# Patient Record
Sex: Female | Born: 1995 | Race: Black or African American | Hispanic: No | Marital: Single | Smoking: Never smoker
Health system: Southern US, Community
[De-identification: ages and names within clinical notes are randomized; demographics above are authoritative.]

## PROBLEM LIST (undated history)

## (undated) DIAGNOSIS — J45909 Unspecified asthma, uncomplicated: Secondary | ICD-10-CM

## (undated) DIAGNOSIS — N390 Urinary tract infection, site not specified: Secondary | ICD-10-CM

## (undated) DIAGNOSIS — B9689 Other specified bacterial agents as the cause of diseases classified elsewhere: Secondary | ICD-10-CM

## (undated) DIAGNOSIS — N760 Acute vaginitis: Secondary | ICD-10-CM

## (undated) HISTORY — PX: NO PAST SURGERIES: SHX2092

---

## 2014-06-21 ENCOUNTER — Ambulatory Visit
Admission: EM | Admit: 2014-06-21 | Discharge: 2014-06-21 | Disposition: A | Discharge status: Home / Self Care | Payer: Medicaid Other | Attending: Family Medicine | Admitting: Family Medicine

## 2014-06-21 DIAGNOSIS — Z79899 Other long term (current) drug therapy: Secondary | ICD-10-CM | POA: Insufficient documentation

## 2014-06-21 DIAGNOSIS — N390 Urinary tract infection, site not specified: Secondary | ICD-10-CM | POA: Insufficient documentation

## 2014-06-21 DIAGNOSIS — N760 Acute vaginitis: Secondary | ICD-10-CM | POA: Diagnosis not present

## 2014-06-21 DIAGNOSIS — N3090 Cystitis, unspecified without hematuria: Secondary | ICD-10-CM | POA: Diagnosis present

## 2014-06-21 DIAGNOSIS — B9689 Other specified bacterial agents as the cause of diseases classified elsewhere: Secondary | ICD-10-CM | POA: Insufficient documentation

## 2014-06-21 DIAGNOSIS — A499 Bacterial infection, unspecified: Secondary | ICD-10-CM

## 2014-06-21 HISTORY — DX: Urinary tract infection, site not specified: N39.0

## 2014-06-21 HISTORY — DX: Other specified bacterial agents as the cause of diseases classified elsewhere: B96.89

## 2014-06-21 HISTORY — DX: Unspecified asthma, uncomplicated: J45.909

## 2014-06-21 HISTORY — DX: Acute vaginitis: N76.0

## 2014-06-21 LAB — URINALYSIS COMPLETE WITH MICROSCOPIC (ARMC ONLY)
Glucose, UA: NEGATIVE mg/dL
Ketones, ur: NEGATIVE mg/dL
NITRITE: NEGATIVE
Protein, ur: 100 mg/dL — AB
Specific Gravity, Urine: 1.03 (ref 1.005–1.030)
pH: 6 (ref 5.0–8.0)

## 2014-06-21 LAB — WET PREP, GENITAL
Clue Cells Wet Prep HPF POC: NONE SEEN
TRICH WET PREP: NONE SEEN
Yeast Wet Prep HPF POC: NONE SEEN

## 2014-06-21 LAB — CHLAMYDIA/NGC RT PCR (ARMC ONLY)
Chlamydia Tr: NOT DETECTED
N gonorrhoeae: NOT DETECTED

## 2014-06-21 MED ORDER — CIPROFLOXACIN HCL 500 MG PO TABS: 500.0000 mg | ORAL_TABLET | Freq: Two times a day (BID) | ORAL | Status: DC

## 2014-06-21 MED ORDER — FLUCONAZOLE 150 MG PO TABS: 150.0000 mg | ORAL_TABLET | Freq: Once | ORAL | Status: DC

## 2014-06-21 MED ORDER — METRONIDAZOLE 500 MG PO TABS: 500.0000 mg | ORAL_TABLET | Freq: Two times a day (BID) | ORAL | Status: DC

## 2014-06-21 NOTE — ED Provider Notes (Addendum)
CSN: [id number]     Arrival date & time 06/21/14  1409 History   First MD Initiated Contact with Patient 06/21/14 1451     Chief Complaint  Patient presents with  . Cystitis   (Consider location/radiation/quality/duration/timing/severity/associated sxs/prior Treatment) Patient is a 19 y.o. female presenting with vaginal discharge and frequency. The history is provided by the patient. No language interpreter was used.  Vaginal Discharge Quality:  Thick and white Severity:  Moderate Onset quality:  Gradual Duration:  3 days Progression:  Worsening Chronicity:  Recurrent Relieved by:  Nothing Worsened by:  Nothing tried Ineffective treatments:  None tried Associated symptoms: abdominal pain, dysuria and urinary frequency   Associated symptoms: no dyspareunia   Risk factors: no new sexual partner and no STI exposure   Urinary Frequency This is a new problem. The problem occurs constantly. The problem has been gradually worsening. Associated symptoms include abdominal pain. Pertinent negatives include no chest pain and no shortness of breath. Nothing aggravates the symptoms. Nothing relieves the symptoms. She has tried nothing for the symptoms.    Past Medical History  Diagnosis Date  . Asthma   . UTI (lower urinary tract infection)   . BV (bacterial vaginosis)    History reviewed. No pertinent past surgical history. History reviewed. No pertinent family history. History  Substance Use Topics  . Smoking status: Never Smoker   . Smokeless tobacco: Not on file  . Alcohol Use: No   OB History    No data available     Review of Systems  Respiratory: Negative for shortness of breath.   Cardiovascular: Negative for chest pain.  Gastrointestinal: Positive for abdominal pain.  Genitourinary: Positive for dysuria, frequency and vaginal discharge. Negative for dyspareunia.    Allergies  Review of patient's allergies indicates no known allergies.  Home Medications   Prior to  Admission medications   Medication Sig Start Date End Date Taking? Authorizing Provider  albuterol (PROVENTIL HFA;VENTOLIN HFA) 108 (90 BASE) MCG/ACT inhaler Inhale 2 puffs into the lungs every 6 (six) hours as needed for wheezing or shortness of breath.    Historical Provider, MD  ciprofloxacin (CIPRO) 500 MG tablet Take 1 tablet (500 mg total) by mouth 2 (two) times daily. 06/21/14   Hassan Rowan, MD  fluconazole (DIFLUCAN) 150 MG tablet Take 1 tablet (150 mg total) by mouth once. 06/21/14   Hassan Rowan, MD  metroNIDAZOLE (FLAGYL) 500 MG tablet Take 1 tablet (500 mg total) by mouth 2 (two) times daily. 06/21/14   Hassan Rowan, MD   BP 121/73 mmHg  Pulse 104  Temp(Src) 98.2 F (36.8 C) (Tympanic)  Resp 16  Ht  (1.499 m)  Wt 140 lb (63.504 kg)  BMI 28.26 kg/m2  SpO2 100%  LMP 05/28/2014 (Approximate) Physical Exam  Constitutional: She is oriented to person, place, and time. She appears well-developed and well-nourished.  HENT:  Head: Normocephalic and atraumatic.  Abdominal: Soft. Bowel sounds are normal. She exhibits no distension. There is no tenderness. There is no rebound. Hernia confirmed negative in the right inguinal area and confirmed negative in the left inguinal area.  Genitourinary: Rectum normal. There is no rash or tenderness on the right labia. There is no rash or tenderness on the left labia. Uterus is not deviated, not enlarged and not fixed. Cervix exhibits discharge. Cervix exhibits no motion tenderness and no friability. Right adnexum displays no mass, no tenderness and no fullness. Left adnexum displays no mass and no tenderness. No erythema, tenderness or  bleeding in the vagina. No foreign body around the vagina. Vaginal discharge found.  Musculoskeletal: Normal range of motion.  Neurological: She is oriented to person, place, and time.  Skin: Skin is warm.  Psychiatric: Her behavior is normal.    ED Course  Procedures (including critical care time) Labs  Review  Results for orders placed or performed during the hospital encounter of 06/21/14  Urine culture  Result Value Ref Range   Specimen Description URINE, CLEAN CATCH    Special Requests Normal    Culture >=100,000 COLONIES/mL ESCHERICHIA COLI    Report Status 06/23/2014 FINAL    Organism ID, Bacteria ESCHERICHIA COLI       Susceptibility   Escherichia coli - MIC*    AMPICILLIN 4 SENSITIVE Sensitive     CEFTAZIDIME <=1 SENSITIVE Sensitive     CEFAZOLIN <=4 SENSITIVE Sensitive     CEFTRIAXONE <=1 SENSITIVE Sensitive     CIPROFLOXACIN <=0.25 SENSITIVE Sensitive     GENTAMICIN <=1 SENSITIVE Sensitive     IMIPENEM <=0.25 SENSITIVE Sensitive     TRIMETH/SULFA <=20 SENSITIVE Sensitive     CEFOXITIN <=4 SENSITIVE Sensitive     * >=100,000 COLONIES/mL ESCHERICHIA COLI  Chlamydia/NGC rt PCR  Result Value Ref Range   Specimen source GC/Chlam RANDOM URINE    Chlamydia Tr NOT DETECTED    N gonorrhoeae NOT DETECTED   Wet prep, genital  Result Value Ref Range   Yeast Wet Prep HPF POC NONE SEEN NONE SEEN   Trich, Wet Prep NONE SEEN NONE SEEN   Clue Cells Wet Prep HPF POC NONE SEEN NONE SEEN   WBC, Wet Prep HPF POC FEW (A) NONE SEEN  Urinalysis complete, with microscopic  Result Value Ref Range   Color, Urine YELLOW YELLOW   APPearance CLOUDY (A) CLEAR   Glucose, UA NEGATIVE NEGATIVE mg/dL   Bilirubin Urine 1+ (A) NEGATIVE   Ketones, ur NEGATIVE NEGATIVE mg/dL   Specific Gravity, Urine 1.030 1.005 - 1.030   Hgb urine dipstick 2+ (A) NEGATIVE   pH 6.0 5.0 - 8.0   Protein, ur 100 (A) NEGATIVE mg/dL   Nitrite NEGATIVE NEGATIVE   Leukocytes, UA 2+ (A) NEGATIVE   RBC / HPF 0-5 <3 RBC/hpf   WBC, UA TOO NUMEROUS TO COUNT <3 WBC/hpf   Bacteria, UA FEW (A) RARE   Squamous Epithelial / LPF 0-5 (A) RARE      Imaging Review No results found.   MDM   1. UTI (urinary tract infection), bacterial   2. Vaginitis        Hassan RowanEugene Herberta Pickron, MD 06/23/14 Nicholos [id number]  Hassan RowanEugene Taegan Standage, MD 06/23/14  [phone]

## 2014-06-21 NOTE — ED Notes (Signed)
States started 2 days ago with low abdominal pain and when voids "has very strong odor". Denies frequency, burning. + vaginal discharge (white)

## 2014-06-21 NOTE — Discharge Instructions (Signed)
Asymptomatic Bacteriuria Asymptomatic bacteriuria is the presence of a large number of bacteria in your urine without the usual symptoms of burning or frequent urination. The following conditions increase the risk of asymptomatic bacteriuria:  Diabetes mellitus.  Advanced age.  Pregnancy in the first trimester.  Kidney stones.  Kidney transplants.  Leaky kidney tube valve in young children (reflux). Treatment for this condition is not needed in most people and can lead to other problems such as too much yeast and growth of resistant bacteria. However, some people, such as pregnant women, do need treatment to prevent kidney infection. Asymptomatic bacteriuria in pregnancy is also associated with fetal growth restriction, premature labor, and newborn death. HOME CARE INSTRUCTIONS Monitor your condition for any changes. The following actions may help to relieve any discomfort you are feeling:  Drink enough water and fluids to keep your urine clear or pale yellow. Go to the bathroom more often to keep your bladder empty.  Keep the area around your vagina and rectum clean. Wipe yourself from front to back after urinating. SEEK IMMEDIATE MEDICAL CARE IF:  You develop signs of an infection such as:  Burning with urination.  Frequency of voiding.  Back pain.  Fever.  You have blood in the urine.  You develop a fever. MAKE SURE YOU:  Understand these instructions.  Will watch your condition.  Will get help right away if you are not doing well or get worse. Document Released: 01/08/2005 Document Revised: 05/25/2013 Document Reviewed: 06/30/2012 [facility] Patient Information 2015 [place], [place]. This information is not intended to replace advice given to you by your health care provider. Make sure you discuss any questions you have with your health care provider.  Bacterial Vaginosis Bacterial vaginosis is a vaginal infection that occurs when the normal balance of bacteria in the  vagina is disrupted. It results from an overgrowth of certain bacteria. This is the most common vaginal infection in women of childbearing age. Treatment is important to prevent complications, especially in pregnant women, as it can cause a premature delivery. CAUSES  Bacterial vaginosis is caused by an increase in harmful bacteria that are normally present in smaller amounts in the vagina. Several different kinds of bacteria can cause bacterial vaginosis. However, the reason that the condition develops is not fully understood. RISK FACTORS Certain activities or behaviors can put you at an increased risk of developing bacterial vaginosis, including:  Having a new sex partner or multiple sex partners.  Douching.  Using an intrauterine device (IUD) for contraception. Women do not get bacterial vaginosis from toilet seats, bedding, swimming pools, or contact with objects around them. SIGNS AND SYMPTOMS  Some women with bacterial vaginosis have no signs or symptoms. Common symptoms include:  Grey vaginal discharge.  A fishlike odor with discharge, especially after sexual intercourse.  Itching or burning of the vagina and vulva.  Burning or pain with urination. DIAGNOSIS  Your health care provider will take a medical history and examine the vagina for signs of bacterial vaginosis. A sample of vaginal fluid may be taken. Your health care provider will look at this sample under a microscope to check for bacteria and abnormal cells. A vaginal pH test may also be done.  TREATMENT  Bacterial vaginosis may be treated with antibiotic medicines. These may be given in the form of a pill or a vaginal cream. A second round of antibiotics may be prescribed if the condition comes back after treatment.  HOME CARE INSTRUCTIONS   Only take over-the-counter or  prescription medicines as directed by your health care provider.  If antibiotic medicine was prescribed, take it as directed. Make sure you finish it  even if you start to feel better.  Do not have sex until treatment is completed.  Tell all sexual partners that you have a vaginal infection. They should see their health care provider and be treated if they have problems, such as a mild rash or itching.  Practice safe sex by using condoms and only having one sex partner. SEEK MEDICAL CARE IF:   Your symptoms are not improving after 3 days of treatment.  You have increased discharge or pain.  You have a fever. MAKE SURE YOU:   Understand these instructions.  Will watch your condition.  Will get help right away if you are not doing well or get worse. FOR MORE INFORMATION  Centers for Disease Control and Prevention, Division of STD Prevention: SolutionApps.co.zawww.cdc.gov/std American Sexual Health Association (ASHA): www.ashastd.org  Document Released: 01/08/2005 Document Revised: 10/29/2012 Document Reviewed: 08/20/2012 [facility] Patient Information 2015 [place], [place]. This information is not intended to replace advice given to you by your health care provider. Make sure you discuss any questions you have with your health care provider.  Cervicitis Cervicitis is a soreness and puffiness (inflammation) of the cervix.  HOME CARE  Do not have sex (intercourse) until your doctor says it is okay.  Do not have sex until your partner is treated or as told by your doctor.  Take your antibiotic medicine as told. Finish it even if you start to feel better. GET HELP IF:   Your symptoms that brought you to the doctor come back.  You have a fever. MAKE SURE YOU:   Understand these instructions.  Will watch your condition.  Will get help right away if you are not doing well or get worse. Document Released: 10/18/2007 Document Revised: 01/13/2013 Document Reviewed: 07/02/2012 [facility] Patient Information 2015 [place], [place]. This information is not intended to replace advice given to you by your health care provider. Make sure you discuss any  questions you have with your health care provider.

## 2014-06-23 LAB — URINE CULTURE: SPECIAL REQUESTS: NORMAL

## 2014-07-27 ENCOUNTER — Ambulatory Visit
Admission: EM | Admit: 2014-07-27 | Discharge: 2014-07-27 | Disposition: A | Discharge status: Home / Self Care | Payer: Medicaid Other | Attending: Family Medicine | Admitting: Family Medicine

## 2014-07-27 DIAGNOSIS — L03031 Cellulitis of right toe: Secondary | ICD-10-CM | POA: Diagnosis not present

## 2014-07-27 DIAGNOSIS — L600 Ingrowing nail: Secondary | ICD-10-CM

## 2014-07-27 MED ORDER — SULFAMETHOXAZOLE-TRIMETHOPRIM 800-160 MG PO TABS
1.0000 | ORAL_TABLET | Freq: Two times a day (BID) | ORAL | Status: DC
Start: 2014-07-27 — End: 2014-09-25

## 2014-07-27 MED ORDER — MUPIROCIN 2 % EX OINT: 1.0000 "application " | TOPICAL_OINTMENT | Freq: Three times a day (TID) | CUTANEOUS | Status: DC

## 2014-07-27 NOTE — ED Provider Notes (Signed)
CSN: [id number]     Arrival date & time 07/27/14  1205 History   First MD Initiated Contact with Patient 07/27/14 1442     Chief Complaint  Patient presents with  . Nail Problem   (Consider location/radiation/quality/duration/timing/severity/associated sxs/prior Treatment) HPI   Is a 19 year old female who presents with right big toe pain along the medial nail bed that had relapse from there this morning. Involving her for about a week to help. She pulls her toenails off and tends to get back to far proximally. Is now become ingrown on the lateral nail fold with hyperpigmentation along the lateral nail bed. She denies any fever or chills.  Past Medical History  Diagnosis Date  . Asthma   . UTI (lower urinary tract infection)   . BV (bacterial vaginosis)    Past Surgical History  Procedure Laterality Date  . No past surgeries     No family history on file. History  Substance Use Topics  . Smoking status: Never Smoker   . Smokeless tobacco: Not on file  . Alcohol Use: 0.0 oz/week    0 Standard drinks or equivalent per week     Comment: occasionally   OB History    No data available     Review of Systems  All other systems reviewed and are negative.   Allergies  Review of patient's allergies indicates no known allergies.  Home Medications   Prior to Admission medications   Medication Sig Start Date End Date Taking? Authorizing Provider  albuterol (PROVENTIL HFA;VENTOLIN HFA) 108 (90 BASE) MCG/ACT inhaler Inhale 2 puffs into the lungs every 6 (six) hours as needed for wheezing or shortness of breath.   Yes Historical Provider, MD  ciprofloxacin (CIPRO) 500 MG tablet Take 1 tablet (500 mg total) by mouth 2 (two) times daily. 06/21/14   Hassan RowanEugene Wade, MD  fluconazole (DIFLUCAN) 150 MG tablet Take 1 tablet (150 mg total) by mouth once. 06/21/14   Hassan RowanEugene Wade, MD  metroNIDAZOLE (FLAGYL) 500 MG tablet Take 1 tablet (500 mg total) by mouth 2 (two) times daily. 06/21/14   Hassan RowanEugene Wade,  MD  mupirocin ointment (BACTROBAN) 2 % Apply 1 application topically 3 (three) times daily. 07/27/14   Lutricia FeilWilliam P Merle Cirelli, PA-C  sulfamethoxazole-trimethoprim (BACTRIM DS,SEPTRA DS) 800-160 MG per tablet Take 1 tablet by mouth 2 (two) times daily. 07/27/14   Lutricia FeilWilliam P Latora Quarry, PA-C   BP 112/81 mmHg  Pulse 77  Temp(Src) 98 F (36.7 C) (Oral)  Resp 18  Ht 4\' 11"  (1.499 m)  Wt 125 lb (56.7 kg)  BMI 25.23 kg/m2  SpO2 100%  LMP 07/22/2013 (Approximate) Physical Exam  Constitutional: She is oriented to person, place, and time. She appears well-developed and well-nourished.  HENT:  Head: Normocephalic and atraumatic.  Eyes: EOM are normal. Pupils are equal, round, and reactive to light.  Musculoskeletal:  Generation of the right hallux shows tenderness with hyperpigmentation along the lateral nail fold there is some fluctuance along the nail fold but no obvious purulence. Her nails are trimmed very short especially towards the lateral edge on the right hallux.  Neurological: She is alert and oriented to person, place, and time. She has normal reflexes.  Skin: Skin is warm and dry.  Psychiatric: She has a normal mood and affect. Her behavior is normal. Judgment normal.    ED Course  Procedures (including critical care time) Labs Review Labs Reviewed - No data to display  Imaging Review No results found.   MDM  1. Ingrown right big toenail   2. Acute paronychia of toe, right     New Prescriptions   MUPIROCIN OINTMENT (BACTROBAN) 2 %    Apply 1 application topically 3 (three) times daily.   SULFAMETHOXAZOLE-TRIMETHOPRIM (BACTRIM DS,SEPTRA DS) 800-160 MG PER TABLET    Take 1 tablet by mouth 2 (two) times daily.  Plan: 1. Diagnosis reviewed with patient 2. rx as per orders; risks, benefits, potential side effects reviewed with patient 3. Recommend supportive treatment with warm compresses, limited standing for 1 week 4. F/u prn if symptoms worsen or don't improve  The long discussion  with the patient regarding my findings today. She has an ingrown toenail resultant paronychia of the lateral nail fold. She needs to allow her nails to grow longer and to trim them straight across. She should use trimmer instead of her fingers. Until her nail regrows she should place dental floss under the edge of the nail to lift it after she has a warm compress or soaking for 10 minutes. I wanted to apply Bactroban to the area including hyperpigmented area. The next week I have recommended she limit her standing to 30 minutes out of every hour as she works as a Conservation officer, nature at [employer]. I've given her 5 days of Septra that she would take twice a day to augment her warm compresses/soaks. She'll follow-up as necessary  Lutricia Feil, PA-C 07/27/14 1514

## 2014-07-27 NOTE — ED Notes (Signed)
Patient complains of right big toe pain. She has toe nail that is infected. She states that problem started one week ago has been worsening. Patient has purulent drainage from area and darkening of skin around area as well.

## 2014-09-25 ENCOUNTER — Ambulatory Visit
Admission: EM | Admit: 2014-09-25 | Discharge: 2014-09-25 | Disposition: A | Discharge status: Home / Self Care | Payer: Medicaid Other | Attending: Internal Medicine | Admitting: Internal Medicine

## 2014-09-25 ENCOUNTER — Encounter: Payer: Self-pay | Admitting: Gynecology

## 2014-09-25 DIAGNOSIS — Z79899 Other long term (current) drug therapy: Secondary | ICD-10-CM | POA: Insufficient documentation

## 2014-09-25 DIAGNOSIS — M545 Low back pain, unspecified: Secondary | ICD-10-CM

## 2014-09-25 DIAGNOSIS — J45909 Unspecified asthma, uncomplicated: Secondary | ICD-10-CM | POA: Insufficient documentation

## 2014-09-25 DIAGNOSIS — R6883 Chills (without fever): Secondary | ICD-10-CM | POA: Diagnosis present

## 2014-09-25 DIAGNOSIS — R8290 Unspecified abnormal findings in urine: Secondary | ICD-10-CM | POA: Insufficient documentation

## 2014-09-25 DIAGNOSIS — M549 Dorsalgia, unspecified: Secondary | ICD-10-CM | POA: Diagnosis present

## 2014-09-25 LAB — URINALYSIS COMPLETE WITH MICROSCOPIC (ARMC ONLY)
Bilirubin Urine: NEGATIVE
Glucose, UA: NEGATIVE mg/dL
HGB URINE DIPSTICK: NEGATIVE
NITRITE: NEGATIVE
PH: 6.5 (ref 5.0–8.0)
SPECIFIC GRAVITY, URINE: 1.025 (ref 1.005–1.030)

## 2014-09-25 MED ORDER — FLUCONAZOLE 200 MG PO TABS: 200.0000 mg | ORAL_TABLET | Freq: Once | ORAL | Status: DC

## 2014-09-25 MED ORDER — NITROFURANTOIN MONOHYD MACRO 100 MG PO CAPS
100.0000 mg | ORAL_CAPSULE | Freq: Two times a day (BID) | ORAL | Status: DC
Start: 2014-09-25 — End: 2015-01-02

## 2014-09-25 NOTE — Discharge Instructions (Signed)
Urinalysis in the urgent care today was abnormal; a urine culture is pending. A prescription for macrobid (nitrofurantoin, an antibiotic) was sent to the [facility] in [place]. A prescription for diflucan (fluconazole, for yeast) was also sent. Your symptoms may be due to a urinary tract infection, or to a different cause. If symptoms of feeling chilled/feverish and low back pain are not improving in a few days, recheck at urgent care or see your primary care provider, to assess for possible other causes of symptoms.

## 2014-09-25 NOTE — ED Provider Notes (Signed)
CSN: [id number]     Arrival date & time 09/25/14  4098 History   First MD Initiated Contact with Patient 09/25/14 [phone]     Chief Complaint  Patient presents with  . Chills  . Tailbone Pain   HPI  Patient is a 19 year old lady who presents today after the onset of chills and tactile temperature last evening, with low back pain. No abdominal pain, no pelvic pain. No nausea or vomiting. No diarrhea. Last bowel movement was 2 days ago, tends to be irregular. Last bowel movement was normal for her, not hard/large, no straining. She was postpartum at the end of March, had a Nexplanon implanted in mid May. She has had spotting since, no change. She has some white discharge, equivocal change in amount, she was not able to characterize further. No odor, no itching. Does not require pantiliner. No dysuria, no urinary incontinence, no change in urinary frequency. Denies trauma to the low back, moving her legs fine. Feels a little shaky in the arms, just since she started to feel bad. No rash, no skin changes. Some headache. Little bit of irritated sore throat, has been coming and going for 2 weeks. No cough.  Past Medical History  Diagnosis Date  . Asthma   . UTI (lower urinary tract infection)   . BV (bacterial vaginosis)    Past Surgical History  Procedure Laterality Date  . No past surgeries     No family history on file. Social History  Substance Use Topics  . Smoking status: Never Smoker   . Smokeless tobacco: None  . Alcohol Use: 0.0 oz/week    0 Standard drinks or equivalent per week     Comment: occasionally  works at [employer] at [employer].  Review of Systems  All other systems reviewed and are negative.   Allergies  Review of patient's allergies indicates no known allergies.  Home Medications   Prior to Admission medications   Medication Sig Start Date End Date Taking? Authorizing Provider  albuterol (PROVENTIL HFA;VENTOLIN HFA) 108 (90 BASE) MCG/ACT inhaler Inhale 2 puffs into  the lungs every 6 (six) hours as needed for wheezing or shortness of breath.   Yes Historical Provider, MD                                      Meds Ordered and Administered this Visit    BP 120/80 mmHg  Pulse 106  Temp(Src) 98.4 F (36.9 C) (Oral)  Resp 16  Ht  (1.549 m)  Wt 118 lb (53.524 kg)  BMI 22.31 kg/m2  SpO2 100%  LMP 08/31/2014    Physical Exam  Constitutional: She is oriented to person, place, and time.  Alert, sitting upright on the end of the exam table, wearing pajamas. Looks tired, and appears a little distracted.   HENT:  Head: Atraumatic.  Eyes:  Conjugate gaze, no eye redness/drainage  Neck: Neck supple.  Cardiovascular: Regular rhythm.   Slightly tachycardic, heart rate 110s  Pulmonary/Chest: No respiratory distress. She has no wheezes. She has no rales.  Lungs clear, symmetric breath sounds  Abdominal: Soft. She exhibits no distension. There is no tenderness. There is no rebound and no guarding.  Musculoskeletal: She exhibits no edema.       Arms: No leg swelling Bilateral paralumbar areas have mild spasm to palpation, no bruise, no focal swelling, no rash, no warmth. No CVAT  Neurological:  She is alert and oriented to person, place, and time.  Patient was able to walk into the urgent care independently, and climb on/off of exam table.  Skin: Skin is warm and dry. No rash noted.  No cyanosis  Nursing note and vitals reviewed.   ED Course  Procedures  Results for orders placed or performed during the hospital encounter of 09/25/14  Urinalysis complete, with microscopic  Result Value Ref Range   Color, Urine YELLOW YELLOW   APPearance CLEAR CLEAR   Glucose, UA NEGATIVE NEGATIVE mg/dL   Bilirubin Urine NEGATIVE NEGATIVE   Ketones, ur TRACE (A) NEGATIVE mg/dL   Specific Gravity, Urine 1.025 1.005 - 1.030   Hgb urine dipstick NEGATIVE NEGATIVE   pH 6.5 5.0 - 8.0   Protein, ur TRACE (A) NEGATIVE mg/dL   Nitrite NEGATIVE NEGATIVE    Leukocytes, UA TRACE (A) NEGATIVE   RBC / HPF 6-30 <3 RBC/hpf   WBC, UA 6-30 <3 WBC/hpf   Bacteria, UA RARE RARE   Squamous Epithelial / LPF 6-30 (A) RARE   Mucous PRESENT       MDM   1. Abnormal urinalysis   2. Midline low back pain without sciatica    Trial of Macrobid, urine culture pending. If chills and low back discomfort are not improving in a couple of days, seek further assessment.    Eustace Moore, MD 09/25/14 1340

## 2014-09-25 NOTE — ED Notes (Signed)
Patient states feeling cold on the inside and hot on the outside with lower back pain times one day

## 2014-09-27 LAB — URINE CULTURE: Culture: 7000

## 2014-09-28 ENCOUNTER — Telehealth: Payer: Self-pay | Admitting: Internal Medicine

## 2014-09-28 NOTE — ED Notes (Signed)
Urine culture growing 7000 colonies of strep agalactiae, number too small to be consistent with UTI in otherwise healthy patient. No further action needed.  Eustace Moore, MD 09/28/14 [phone]

## 2015-01-02 ENCOUNTER — Encounter: Payer: Self-pay | Admitting: Gynecology

## 2015-01-02 ENCOUNTER — Ambulatory Visit
Admission: EM | Admit: 2015-01-02 | Discharge: 2015-01-02 | Disposition: A | Discharge status: Home / Self Care | Payer: No Typology Code available for payment source | Attending: Family Medicine | Admitting: Family Medicine

## 2015-01-02 ENCOUNTER — Ambulatory Visit: Payer: No Typology Code available for payment source

## 2015-01-02 DIAGNOSIS — S134XXA Sprain of ligaments of cervical spine, initial encounter: Secondary | ICD-10-CM | POA: Insufficient documentation

## 2015-01-02 DIAGNOSIS — S29019A Strain of muscle and tendon of unspecified wall of thorax, initial encounter: Secondary | ICD-10-CM

## 2015-01-02 DIAGNOSIS — N83209 Unspecified ovarian cyst, unspecified side: Secondary | ICD-10-CM

## 2015-01-02 DIAGNOSIS — S29012A Strain of muscle and tendon of back wall of thorax, initial encounter: Secondary | ICD-10-CM | POA: Insufficient documentation

## 2015-01-02 DIAGNOSIS — R1012 Left upper quadrant pain: Secondary | ICD-10-CM | POA: Insufficient documentation

## 2015-01-02 DIAGNOSIS — R1032 Left lower quadrant pain: Secondary | ICD-10-CM | POA: Diagnosis not present

## 2015-01-02 DIAGNOSIS — S39012A Strain of muscle, fascia and tendon of lower back, initial encounter: Secondary | ICD-10-CM | POA: Diagnosis not present

## 2015-01-02 LAB — COMPREHENSIVE METABOLIC PANEL
ALBUMIN: 4.3 g/dL (ref 3.5–5.0)
ALT: 16 U/L (ref 14–54)
ANION GAP: 8 (ref 5–15)
AST: 30 U/L (ref 15–41)
Alkaline Phosphatase: 103 U/L (ref 38–126)
BUN: 12 mg/dL (ref 6–20)
CALCIUM: 9.3 mg/dL (ref 8.9–10.3)
CO2: 25 mmol/L (ref 22–32)
Chloride: 105 mmol/L (ref 101–111)
Creatinine, Ser: 0.92 mg/dL (ref 0.44–1.00)
GFR calc Af Amer: >60 mL/min (ref >60)
GFR calc non Af Amer: >60 mL/min (ref >60)
GLUCOSE: 96 mg/dL (ref 65–99)
Potassium: 3.5 mmol/L (ref 3.5–5.1)
SODIUM: 138 mmol/L (ref 135–145)
Total Bilirubin: 1 mg/dL (ref 0.3–1.2)
Total Protein: 7.8 g/dL (ref 6.5–8.1)

## 2015-01-02 LAB — CBC WITH DIFFERENTIAL/PLATELET
Basophils Absolute: 0 10*3/uL (ref 0–0.1)
Basophils Relative: 0 %
EOS ABS: 0.1 10*3/uL (ref 0–0.7)
Eosinophils Relative: 1 %
HEMATOCRIT: 40.5 % (ref 35.0–47.0)
Hemoglobin: 13.2 g/dL (ref 12.0–16.0)
Lymphocytes Relative: 22 %
Lymphs Abs: 1.2 10*3/uL (ref 1.0–3.6)
MCH: 26.7 pg (ref 26.0–34.0)
MCHC: 32.6 g/dL (ref 32.0–36.0)
MCV: 81.8 fL (ref 80.0–100.0)
MONOS PCT: 7 %
Monocytes Absolute: 0.4 10*3/uL (ref 0.2–0.9)
NEUTROS PCT: 70 %
Neutro Abs: 3.9 10*3/uL (ref 1.4–6.5)
Platelets: 203 10*3/uL (ref 150–440)
RBC: 4.95 MIL/uL (ref 3.80–5.20)
RDW: 15.7 % — ABNORMAL HIGH (ref 11.5–14.5)
WBC: 5.6 10*3/uL (ref 3.6–11.0)

## 2015-01-02 LAB — HCG, QUANTITATIVE, PREGNANCY: hCG, Beta Chain, Quant, S: <1 m[IU]/mL (ref <5)

## 2015-01-02 MED ORDER — IOHEXOL 300 MG/ML  SOLN
125.0000 mL | Freq: Once | INTRAMUSCULAR | Status: AC | PRN
  Administered 2015-01-02: 125 mL via INTRAVENOUS

## 2015-01-02 MED ORDER — KETOROLAC TROMETHAMINE 60 MG/2ML IM SOLN
60.0000 mg | Freq: Once | INTRAMUSCULAR | Status: AC
  Administered 2015-01-02: 60 mg via INTRAMUSCULAR

## 2015-01-02 MED ORDER — NAPROXEN 500 MG PO TABS: 500.0000 mg | ORAL_TABLET | Freq: Two times a day (BID) | ORAL | Status: AC

## 2015-01-02 MED ORDER — CYCLOBENZAPRINE HCL 5 MG PO TABS: 5.0000 mg | ORAL_TABLET | Freq: Three times a day (TID) | ORAL | Status: AC | PRN
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
## 2015-01-02 NOTE — Discharge Instructions (Signed)
Cervical Sprain A cervical sprain is an injury in the neck in which the strong, fibrous tissues (ligaments) that connect your neck bones stretch or tear. Cervical sprains can range from mild to severe. Severe cervical sprains can cause the neck vertebrae to be unstable. This can lead to damage of the spinal cord and can result in serious nervous system problems. The amount of time it takes for a cervical sprain to get better depends on the cause and extent of the injury. Most cervical sprains heal in 1 to 3 weeks. CAUSES  Severe cervical sprains may be caused by:   Contact sport injuries (such as from football, rugby, wrestling, hockey, auto racing, gymnastics, diving, martial arts, or boxing).   Motor vehicle collisions.   Whiplash injuries. This is an injury from a sudden forward and backward whipping movement of the head and neck.  Falls.  Mild cervical sprains may be caused by:   Being in an awkward position, such as while cradling a telephone between your ear and shoulder.   Sitting in a chair that does not offer proper support.   Working at a poorly [employer] station.   Looking up or down for long periods of time.  SYMPTOMS   Pain, soreness, stiffness, or a burning sensation in the front, back, or sides of the neck. This discomfort may develop immediately after the injury or slowly, 24 hours or more after the injury.   Pain or tenderness directly in the middle of the back of the neck.   Shoulder or upper back pain.   Limited ability to move the neck.   Headache.   Dizziness.   Weakness, numbness, or tingling in the hands or arms.   Muscle spasms.   Difficulty swallowing or chewing.   Tenderness and swelling of the neck.  DIAGNOSIS  Most of the time your health care provider can diagnose a cervical sprain by taking your history and doing a physical exam. Your health care provider will ask about previous neck injuries and any known neck  problems, such as arthritis in the neck. X-rays may be taken to find out if there are any other problems, such as with the bones of the neck. Other tests, such as a CT scan or MRI, may also be needed.  TREATMENT  Treatment depends on the severity of the cervical sprain. Mild sprains can be treated with rest, keeping the neck in place (immobilization), and pain medicines. Severe cervical sprains are immediately immobilized. Further treatment is done to help with pain, muscle spasms, and other symptoms and may include:  Medicines, such as pain relievers, numbing medicines, or muscle relaxants.   Physical therapy. This may involve stretching exercises, strengthening exercises, and posture training. Exercises and improved posture can help stabilize the neck, strengthen muscles, and help stop symptoms from returning.  HOME CARE INSTRUCTIONS   Put ice on the injured area.   Put ice in a plastic bag.   Place a towel between your skin and the bag.   Leave the ice on for 15-20 minutes, 3-4 times a day.   If your injury was severe, you may have been given a cervical collar to wear. A cervical collar is a two-piece collar designed to keep your neck from moving while it heals.  Do not remove the collar unless instructed by your health care provider.  If you have long hair, keep it outside of the collar.  Ask your health care provider before making any adjustments to your collar. Minor  adjustments may be required over time to improve comfort and reduce pressure on your chin or on the back of your head.  Ifyou are allowed to remove the collar for cleaning or bathing, follow your health care provider's instructions on how to do so safely.  Keep your collar clean by wiping it with mild soap and water and drying it completely. If the collar you have been given includes removable pads, remove them every 1-2 days and hand wash them with soap and water. Allow them to air dry. They should be completely  dry before you wear them in the collar.  If you are allowed to remove the collar for cleaning and bathing, wash and dry the skin of your neck. Check your skin for irritation or sores. If you see any, tell your health care provider.  Do not drive while wearing the collar.   Only take over-the-counter or prescription medicines for pain, discomfort, or fever as directed by your health care provider.   Keep all follow-up appointments as directed by your health care provider.   Keep all physical therapy appointments as directed by your health care provider.   Make any needed adjustments to your workstation to promote good posture.   Avoid positions and activities that make your symptoms worse.   Warm up and stretch before being active to help prevent problems.  SEEK MEDICAL CARE IF:   Your pain is not controlled with medicine.   You are unable to decrease your pain medicine over time as planned.   Your activity level is not improving as expected.  SEEK IMMEDIATE MEDICAL CARE IF:   You develop any bleeding.  You develop stomach upset.  You have signs of an allergic reaction to your medicine.   Your symptoms get worse.   You develop new, unexplained symptoms.   You have numbness, tingling, weakness, or paralysis in any part of your body.  MAKE SURE YOU:   Understand these instructions.  Will watch your condition.  Will get help right away if you are not doing well or get worse.   This information is not intended to replace advice given to you by your health care provider. Make sure you discuss any questions you have with your health care provider.   Document Released: 11/05/2006 Document Revised: 01/13/2013 Document Reviewed: 07/16/2012 Elsevier Interactive Patient Education 2016 [employer] Inc[employer] Cryotherapy Cryotherapy means treatment with cold. Ice or gel packs can be used to reduce both pain and swelling. Ice is the most helpful within the first 24 to 48  hours after an injury or flare-up from overusing a muscle or joint. Sprains, strains, spasms, burning pain, shooting pain, and aches can all be eased with ice. Ice can also be used when recovering from surgery. Ice is effective, has very few side effects, and is safe for most people to use. PRECAUTIONS  Ice is not a safe treatment option for people with:  Raynaud phenomenon. This is a condition affecting small blood vessels in the extremities. Exposure to cold may cause your problems to return.  Cold hypersensitivity. There are many forms of cold hypersensitivity, including:  Cold urticaria. Red, itchy hives appear on the skin when the tissues begin to warm after being iced.  Cold erythema. This is a red, itchy rash caused by exposure to cold.  Cold hemoglobinuria. Red blood cells break down when the tissues begin to warm after being iced. The hemoglobin that carry oxygen are passed into the urine because they cannot combine with blood  proteins fast enough.  Numbness or altered sensitivity in the area being iced. If you have any of the following conditions, do not use ice until you have discussed cryotherapy with your caregiver:  Heart conditions, such as arrhythmia, angina, or chronic heart disease.  High blood pressure.  Healing wounds or open skin in the area being iced.  Current infections.  Rheumatoid arthritis.  Poor circulation.  Diabetes. Ice slows the blood flow in the region it is applied. This is beneficial when trying to stop inflamed tissues from spreading irritating chemicals to surrounding tissues. However, if you expose your skin to cold temperatures for too long or without the proper protection, you can damage your skin or nerves. Watch for signs of skin damage due to cold. HOME CARE INSTRUCTIONS Follow these tips to use ice and cold packs safely.  Place a dry or damp towel between the ice and skin. A damp towel will cool the skin more quickly, so you may need to  shorten the time that the ice is used.  For a more rapid response, add gentle compression to the ice.  Ice for no more than 10 to 20 minutes at a time. The bonier the area you are icing, the less time it will take to get the benefits of ice.  Check your skin after 5 minutes to make sure there are no signs of a poor response to cold or skin damage.  Rest 20 minutes or more between uses.  Once your skin is numb, you can end your treatment. You can test numbness by very lightly touching your skin. The touch should be so light that you do not see the skin dimple from the pressure of your fingertip. When using ice, most people will feel these normal sensations in this order: cold, burning, aching, and numbness.  Do not use ice on someone who cannot communicate their responses to pain, such as small children or people with dementia. HOW TO MAKE AN ICE PACK Ice packs are the most common way to use ice therapy. Other methods include ice massage, ice baths, and cryosprays. Muscle creams that cause a cold, tingly feeling do not offer the same benefits that ice offers and should not be used as a substitute unless recommended by your caregiver. To make an ice pack, do one of the following:  Place crushed ice or a bag of frozen vegetables in a sealable plastic bag. Squeeze out the excess air. Place this bag inside another plastic bag. Slide the bag into a pillowcase or place a damp towel between your skin and the bag.  Mix 3 parts water with 1 part rubbing alcohol. Freeze the mixture in a sealable plastic bag. When you remove the mixture from the freezer, it will be slushy. Squeeze out the excess air. Place this bag inside another plastic bag. Slide the bag into a pillowcase or place a damp towel between your skin and the bag. SEEK MEDICAL CARE IF:  You develop white spots on your skin. This may give the skin a blotchy (mottled) appearance.  Your skin turns blue or pale.  Your skin becomes waxy or  hard.  Your swelling gets worse. MAKE SURE YOU:   Understand these instructions.  Will watch your condition.  Will get help right away if you are not doing well or get worse.   This information is not intended to replace advice given to you by your health care provider. Make sure you discuss any questions you have with your  health care provider.   Document Released: 09/04/2010 Document Revised: 01/29/2014 Document Reviewed: 09/04/2010 Elsevier Interactive Patient Education 2016 [employer] Inc[employer] Therapy Heat therapy can help ease sore, stiff, injured, and tight muscles and joints. Heat relaxes your muscles, which may help ease your pain.  RISKS AND COMPLICATIONS If you have any of the following conditions, do not use heat therapy unless your health care provider has approved:  Poor circulation.  Healing wounds or scarred skin in the area being treated.  Diabetes, heart disease, or high blood pressure.  Not being able to feel (numbness) the area being treated.  Unusual swelling of the area being treated.  Active infections.  Blood clots.  Cancer.  Inability to communicate pain. This may include young children and people who have problems with their brain function (dementia).  Pregnancy. Heat therapy should only be used on old, pre-existing, or long-lasting (chronic) injuries. Do not use heat therapy on new injuries unless directed by your health care provider. HOW TO USE HEAT THERAPY There are several different kinds of heat therapy, including:  Moist heat pack.  Warm water bath.  Hot water bottle.  Electric heating pad.  Heated gel pack.  Heated wrap.  Electric heating pad. Use the heat therapy method suggested by your health care provider. Follow your health care provider's instructions on when and how to use heat therapy. GENERAL HEAT THERAPY RECOMMENDATIONS  Do not sleep while using heat therapy. Only use heat therapy while you are awake.  Your skin  may turn pink while using heat therapy. Do not use heat therapy if your skin turns red.  Do not use heat therapy if you have new pain.  High heat or long exposure to heat can cause burns. Be careful when using heat therapy to avoid burning your skin.  Do not use heat therapy on areas of your skin that are already irritated, such as with a rash or sunburn. SEEK MEDICAL CARE IF:  You have blisters, redness, swelling, or numbness.  You have new pain.  Your pain is worse. MAKE SURE YOU:  Understand these instructions.  Will watch your condition.  Will get help right away if you are not doing well or get worse.   This information is not intended to replace advice given to you by your health care provider. Make sure you discuss any questions you have with your health care provider.   Document Released: 04/02/2011 Document Revised: 01/29/2014 Document Reviewed: 03/03/2013 Elsevier Interactive Patient Education 2016 [employer] Inc[employer] Abdominal Pain, Adult Many things can cause abdominal pain. Usually, abdominal pain is not caused by a disease and will improve without treatment. It can often be observed and treated at home. Your health care provider will do a physical exam and possibly order blood tests and X-rays to help determine the seriousness of your pain. However, in many cases, more time must pass before a clear cause of the pain can be found. Before that point, your health care provider may not know if you need more testing or further treatment. HOME CARE INSTRUCTIONS Monitor your abdominal pain for any changes. The following actions may help to alleviate any discomfort you are experiencing:  Only take over-the-counter or prescription medicines as directed by your health care provider.  Do not take laxatives unless directed to do so by your health care provider.  Try a clear liquid diet (broth, tea, or water) as directed by your health care provider. Slowly move to a bland diet as  tolerated. SEEK MEDICAL  CARE IF:  You have unexplained abdominal pain.  You have abdominal pain associated with nausea or diarrhea.  You have pain when you urinate or have a bowel movement.  You experience abdominal pain that wakes you in the night.  You have abdominal pain that is worsened or improved by eating food.  You have abdominal pain that is worsened with eating fatty foods.  You have a fever. SEEK IMMEDIATE MEDICAL CARE IF:  Your pain does not go away within 2 hours.  You keep throwing up (vomiting).  Your pain is felt only in portions of the abdomen, such as the right side or the left lower portion of the abdomen.  You pass bloody or black tarry stools. MAKE SURE YOU:  Understand these instructions.  Will watch your condition.  Will get help right away if you are not doing well or get worse.   This information is not intended to replace advice given to you by your health care provider. Make sure you discuss any questions you have with your health care provider.   Document Released: 10/18/2004 Document Revised: 09/29/2014 Document Reviewed: 09/17/2012 Elsevier Interactive Patient Education 2016 [employer] Inc[employer] Ovarian Cyst An ovarian cyst is a fluid-filled sac that forms on an ovary. The ovaries are small organs that produce eggs in women. Various types of cysts can form on the ovaries. Most are not cancerous. Many do not cause problems, and they often go away on their own. Some may cause symptoms and require treatment. Common types of ovarian cysts include:  Functional cysts--These cysts may occur every month during the menstrual cycle. This is normal. The cysts usually go away with the next menstrual cycle if the woman does not get pregnant. Usually, there are no symptoms with a functional cyst.  Endometrioma cysts--These cysts form from the tissue that lines the uterus. They are also called "chocolate cysts" because they become filled with blood that turns brown.  This type of cyst can cause pain in the lower abdomen during intercourse and with your menstrual period.  Cystadenoma cysts--This type develops from the cells on the outside of the ovary. These cysts can get very big and cause lower abdomen pain and pain with intercourse. This type of cyst can twist on itself, cut off its blood supply, and cause severe pain. It can also easily rupture and cause a lot of pain.  Dermoid cysts--This type of cyst is sometimes found in both ovaries. These cysts may contain different kinds of body tissue, such as skin, teeth, hair, or cartilage. They usually do not cause symptoms unless they get very big.  Theca lutein cysts--These cysts occur when too much of a certain hormone (human chorionic gonadotropin) is produced and overstimulates the ovaries to produce an egg. This is most common after procedures used to assist with the conception of a baby (in vitro fertilization). CAUSES   Fertility drugs can cause a condition in which multiple large cysts are formed on the ovaries. This is called ovarian hyperstimulation syndrome.  A condition called polycystic ovary syndrome can cause hormonal imbalances that can lead to nonfunctional ovarian cysts. SIGNS AND SYMPTOMS  Many ovarian cysts do not cause symptoms. If symptoms are present, they may include:  Pelvic pain or pressure.  Pain in the lower abdomen.  Pain during sexual intercourse.  Increasing girth (swelling) of the abdomen.  Abnormal menstrual periods.  Increasing pain with menstrual periods.  Stopping having menstrual periods without being pregnant. DIAGNOSIS  These cysts are commonly found during  a routine or annual pelvic exam. Tests may be ordered to find out more about the cyst. These tests may include:  Ultrasound.  X-ray of the pelvis.  CT scan.  MRI.  Blood tests. TREATMENT  Many ovarian cysts go away on their own without treatment. Your health care provider may want to check your cyst  regularly for 2-3 months to see if it changes. For women in menopause, it is particularly important to monitor a cyst closely because of the higher rate of ovarian cancer in menopausal women. When treatment is needed, it may include any of the following:  A procedure to drain the cyst (aspiration). This may be done using a long needle and ultrasound. It can also be done through a laparoscopic procedure. This involves using a thin, lighted tube with a tiny camera on the end (laparoscope) inserted through a small incision.  Surgery to remove the whole cyst. This may be done using laparoscopic surgery or an open surgery involving a larger incision in the lower abdomen.  Hormone treatment or birth control pills. These methods are sometimes used to help dissolve a cyst. HOME CARE INSTRUCTIONS   Only take over-the-counter or prescription medicines as directed by your health care provider.  Follow up with your health care provider as directed.  Get regular pelvic exams and Pap tests. SEEK MEDICAL CARE IF:   Your periods are late, irregular, or painful, or they stop.  Your pelvic pain or abdominal pain does not go away.  Your abdomen becomes larger or swollen.  You have pressure on your bladder or trouble emptying your bladder completely.  You have pain during sexual intercourse.  You have feelings of fullness, pressure, or discomfort in your stomach.  You lose weight for no apparent reason.  You feel generally ill.  You become constipated.  You lose your appetite.  You develop acne.  You have an increase in body and facial hair.  You are gaining weight, without changing your exercise and eating habits.  You think you are pregnant. SEEK IMMEDIATE MEDICAL CARE IF:   You have increasing abdominal pain.  You feel sick to your stomach (nauseous), and you throw up (vomit).  You develop a fever that comes on suddenly.  You have abdominal pain during a bowel movement.  Your  menstrual periods become heavier than usual. MAKE SURE YOU:  Understand these instructions.  Will watch your condition.  Will get help right away if you are not doing well or get worse.   This information is not intended to replace advice given to you by your health care provider. Make sure you discuss any questions you have with your health care provider.   Document Released: 01/08/2005 Document Revised: 01/13/2013 Document Reviewed: 09/15/2012 Elsevier Interactive Patient Education 2016 [employer] Inc[employer] Contusion A contusion is a deep bruise. Contusions are the result of a blunt injury to tissues and muscle fibers under the skin. The injury causes bleeding under the skin. The skin overlying the contusion may turn blue, purple, or yellow. Minor injuries will give you a painless contusion, but more severe contusions may stay painful and swollen for a few weeks.  CAUSES  This condition is usually caused by a blow, trauma, or direct force to an area of the body. SYMPTOMS  Symptoms of this condition include:  Swelling of the injured area.  Pain and tenderness in the injured area.  Discoloration. The area may have redness and then turn blue, purple, or yellow. DIAGNOSIS  This condition is  diagnosed based on a physical exam and medical history. An X-ray, CT scan, or MRI may be needed to determine if there are any associated injuries, such as broken bones (fractures). TREATMENT  Specific treatment for this condition depends on what area of the body was injured. In general, the best treatment for a contusion is resting, icing, applying pressure to (compression), and elevating the injured area. This is often called the RICE strategy. Over-the-counter anti-inflammatory medicines may also be recommended for pain control.  HOME CARE INSTRUCTIONS   Rest the injured area.  If directed, apply ice to the injured area:  Put ice in a plastic bag.  Place a towel between your skin and the  bag.  Leave the ice on for 20 minutes, 2-3 times per day.  If directed, apply light compression to the injured area using an elastic bandage. Make sure the bandage is not wrapped too tightly. Remove and reapply the bandage as directed by your health care provider.  If possible, raise (elevate) the injured area above the level of your heart while you are sitting or lying down.  Take over-the-counter and prescription medicines only as told by your health care provider. SEEK MEDICAL CARE IF:  Your symptoms do not improve after several days of treatment.  Your symptoms get worse.  You have difficulty moving the injured area. SEEK IMMEDIATE MEDICAL CARE IF:   You have severe pain.  You have numbness in a hand or foot.  Your hand or foot turns pale or cold.   This information is not intended to replace advice given to you by your health care provider. Make sure you discuss any questions you have with your health care provider.   Document Released: 10/18/2004 Document Revised: 09/29/2014 Document Reviewed: 05/26/2014 Elsevier Interactive Patient Education 2016 [employer] Inc[employer] Muscle Cramps and Spasms Muscle cramps and spasms occur when a muscle or muscles tighten and you have no control over this tightening (involuntary muscle contraction). They are a common problem and can develop in any muscle. The most common place is in the calf muscles of the leg. Both muscle cramps and muscle spasms are involuntary muscle contractions, but they also have differences:   Muscle cramps are sporadic and painful. They may last a few seconds to a quarter of an hour. Muscle cramps are often more forceful and last longer than muscle spasms.  Muscle spasms may or may not be painful. They may also last just a few seconds or much longer. CAUSES  It is uncommon for cramps or spasms to be due to a serious underlying problem. In many cases, the cause of cramps or spasms is unknown. Some common causes are:    Overexertion.   Overuse from repetitive motions (doing the same thing over and over).   Remaining in a certain position for a long period of time.   Improper preparation, form, or technique while performing a sport or activity.   Dehydration.   Injury.   Side effects of some medicines.   Abnormally low levels of the salts and ions in your blood (electrolytes), especially potassium and calcium. This could happen if you are taking water pills (diuretics) or you are pregnant.  Some underlying medical problems can make it more likely to develop cramps or spasms. These include, but are not limited to:   Diabetes.   Parkinson disease.   Hormone disorders, such as thyroid problems.   Alcohol abuse.   Diseases specific to muscles, joints, and bones.   Blood vessel disease  where not enough blood is getting to the muscles.  HOME CARE INSTRUCTIONS   Stay well hydrated. Drink enough water and fluids to keep your urine clear or pale yellow.  It may be helpful to massage, stretch, and relax the affected muscle.  For tight or tense muscles, use a warm towel, heating pad, or hot shower water directed to the affected area.  If you are sore or have pain after a cramp or spasm, applying ice to the affected area may relieve discomfort.  Put ice in a plastic bag.  Place a towel between your skin and the bag.  Leave the ice on for 15-20 minutes, 03-04 times a day.  Medicines used to treat a known cause of cramps or spasms may help reduce their frequency or severity. Only take over-the-counter or prescription medicines as directed by your caregiver. SEEK MEDICAL CARE IF:  Your cramps or spasms get more severe, more frequent, or do not improve over time.  MAKE SURE YOU:   Understand these instructions.  Will watch your condition.  Will get help right away if you are not doing well or get worse.   This information is not intended to replace advice given to you by your  health care provider. Make sure you discuss any questions you have with your health care provider.   Document Released: 06/30/2001 Document Revised: 05/05/2012 Document Reviewed: 12/26/2011 Elsevier Interactive Patient Education 2016 [employer] Mid-Back Strain With Rehab  A strain is an injury in which a tendon or muscle is torn. The muscles and tendons of the mid-back are vulnerable to strains. However, these muscles and tendons are very strong and require a great force to be injured. The muscles of the mid-back are responsible for stabilizing the spinal column, as well as spinal twisting (rotation). Strains are classified into three categories. Grade 1 strains cause pain, but the tendon is not lengthened. Grade 2 strains include a lengthened ligament, due to the ligament being stretched or partially ruptured. With grade 2 strains there is still function, although the function may be decreased. Grade 3 strains involve a complete tear of the tendon or muscle, and function is usually impaired. SYMPTOMS   Pain in the middle of the back.  Pain that may affect only one side, and is worse with movement.  Muscle spasms, and often swelling in the back.  Loss of strength of the back muscles.  Crackling sound (crepitation) when the muscles are touched. CAUSES  Mid-back strains occur when a force is placed on the muscles or tendons that is greater than they can handle. Common causes of injury include:  Ongoing overuse of the muscle-tendon units in the middle back, usually from incorrect body posture.  A single violent injury or force applied to the back. RISK INCREASES WITH:  Sports that involve twisting forces on the spine or a lot of bending at the waist (football, rugby, weightlifting, bowling, golf, tennis, speed skating, racquetball, swimming, running, gymnastics, diving).  Poor strength and flexibility.  Failure to warm up properly before activity.  Family history of low back pain or  disk disorders.  Previous back injury or surgery (especially fusion). PREVENTION  Learn and use proper sports technique.  Warm up and stretch properly before activity.  Allow for adequate recovery between workouts.  Maintain physical fitness:  Strength, flexibility, and endurance.  Cardiovascular fitness. PROGNOSIS  If treated properly, mid-back strains usually heal within 6 weeks. RELATED COMPLICATIONS   Frequently recurring symptoms, resulting in a chronic problem. Properly  treating the problem the first time decreases frequency of recurrence.  Chronic inflammation, scarring, and partial muscle-tendon tear.  Delayed healing or resolution of symptoms, especially if activity is resumed too soon.  Prolonged disability. TREATMENT Treatment first involves the use of ice and medicine, to reduce pain and inflammation. As the pain begins to subside, you may begin strengthening and stretching exercises to improve body posture and sport technique. These exercises may be performed at home or with a therapist. Severe injuries may require referral to a therapist for further evaluation and treatment, such as ultrasound. Corticosteroid injections may be given to help reduce inflammation. Biofeedback (watching monitors of your body processes) and psychotherapy may also be prescribed. Prolonged bed rest is felt to do more harm than good. Massage may help break the muscle spasms. Sometimes, an injection of cortisone, with or without local anesthetics, may be given to help relieve the pain and spasms. MEDICATION   If pain medicine is needed, nonsteroidal anti-inflammatory medicines (aspirin and ibuprofen), or other minor pain relievers (acetaminophen), are often advised.  Do not take pain medicine for 7 days before surgery.  Prescription pain relievers may be given, if your caregiver thinks they are needed. Use only as directed and only as much as you need.  Ointments applied to the skin may be  helpful.  Corticosteroid injections may be given by your caregiver. These injections should be reserved for the most serious cases, because they may only be given a certain number of times. HEAT AND COLD:   Cold treatment (icing) should be applied for 10 to 15 minutes every 2 to 3 hours for inflammation and pain, and immediately after activity that aggravates your symptoms. Use ice packs or an ice massage.  Heat treatment may be used before performing stretching and strengthening activities prescribed by your caregiver, physical therapist, or athletic trainer. Use a heat pack or a warm water soak. SEEK IMMEDIATE MEDICAL CARE IF:  Symptoms get worse or do not improve in 2 to 4 weeks, despite treatment.  You develop numbness, weakness, or loss of bowel or bladder function.  New, unexplained symptoms develop. (Drugs used in treatment may produce side effects.) EXERCISES RANGE OF MOTION (ROM) AND STRETCHING EXERCISES - Mid-Back Strain These exercises may help you when beginning to rehabilitate your injury. In order to successfully resolve your symptoms, you must improve your posture. These exercises are designed to help reduce the forward-head and rounded-shoulder posture which contributes to this condition. Your symptoms may resolve with or without further involvement from your physician, physical therapist or athletic trainer. While completing these exercises, remember:   Restoring tissue flexibility helps normal motion to return to the joints. This allows healthier, less painful movement and activity.  An effective stretch should be held for at least 30 seconds.  A stretch should never be painful. You should only feel a gentle lengthening or release in the stretched tissue. STRETCH - Axial Extension  Stand or sit on a firm surface. Assume a good posture: chest up, shoulders drawn back, stomach muscles slightly tense, knees unlocked (if standing) and feet hip width apart.  Slowly retract  your chin, so your head slides back and your chin slightly lowers. Continue to look straight ahead.  You should feel a gentle stretch in the back of your head. Be certain not to feel an aggressive stretch since this can cause headaches later.  Hold for __________ seconds. Repeat __________ times. Complete this exercise __________ times per day. RANGE OF MOTION- Upper Thoracic Extension  Sit on a firm chair with a high back. Assume a good posture: chest up, shoulders drawn back, abdominal muscles slightly tense, and feet hip width apart. Place a small pillow or folded towel in the curve of your lower back, if you are having difficulty maintaining good posture.  Gently brace your neck with your hands, allowing your arms to rest on your chest.  Continue to support your neck and slowly extend your back over the chair. You will feel a stretch across your upper back.  Hold __________ seconds. Slowly return to the starting position. Repeat __________ times. Complete this exercise __________ times per day. RANGE OF MOTION- Mid-Thoracic Extension  Roll a towel so that it is about 4 inches in diameter.  Position the towel lengthwise. Lay on the towel so that your spine, but not your shoulder blades, are supported.  You should feel your mid-back arching toward the floor. To increase the stretch, extend your arms away from your body.  Hold for __________ seconds. Repeat exercise __________ times, __________ times per day. STRENGTHENING EXERCISES - Mid-Back Strain These exercises may help you when beginning to rehabilitate your injury. They may resolve your symptoms with or without further involvement from your physician, physical therapist or athletic trainer. While completing these exercises, remember:   Muscles can gain both the endurance and the strength needed for everyday activities through controlled exercises.  Complete these exercises as instructed by your physician, physical therapist or  athletic trainer. Increase the resistance and repetitions only as guided by your caregiver.  You may experience muscle soreness or fatigue, but the pain or discomfort you are trying to eliminate should never worsen during these exercises. If this pain does worsen, stop and make certain you are following the directions exactly. If the pain is still present after adjustments, discontinue the exercise until you can discuss the trouble with your caregiver. STRENGTHENING - Quadruped, Opposite UE/LE Lift  Assume a hands and knees position on a firm surface. Keep your hands under your shoulders and your knees under your hips. You may place padding under your knees for comfort.  Find your neutral spine and gently tense your abdominal muscles so that you can maintain this position. Your shoulders and hips should form a rectangle that is parallel with the floor and is not twisted.  Keeping your trunk steady, lift your right hand no higher than your shoulder and then your left leg no higher than your hip. Make sure you are not holding your breath. Hold this position __________ seconds.  Continuing to keep your abdominal muscles tense and your back steady, slowly return to your starting position. Repeat with the opposite arm and leg. Repeat __________ times. Complete this exercise __________ times per day.  STRENGTH - Shoulder Extensors  Secure a rubber exercise band or tubing to a fixed object (table, pole) so that it is at the height of your shoulders when you are either standing, or sitting on a firm armless chair.  With a thumbs-up grip, grasp an end of the band in each hand. Straighten your elbows and lift your hands straight in front of you at shoulder height. Step back away from the secured end of band, until it becomes tense.  Squeezing your shoulder blades together, pull your hands down to the sides of your thighs. Do not allow your hands to go behind you.  Hold for __________ seconds. Slowly ease  the tension on the band, as you reverse the directions and return to the starting position.  Repeat __________ times. Complete this exercise __________ times per day.  STRENGTH - Horizontal Abductors Choose one of the two positions to complete this exercise. Prone: lying on stomach:  Lie on your stomach on a firm surface so that your right / left arm overhangs the edge. Rest your forehead on your opposite forearm. With your palm facing the floor and your elbow straight, hold a __________ weight in your hand.  Squeeze your right / left shoulder blade to your mid-back spine and then slowly raise your arm to the height of the bed.  Hold for __________ seconds. Slowly reverse the directions and return to the starting position, controlling the weight as you lower your arm. Repeat __________ times. Complete this exercise __________ times per day. Standing:   Secure a rubber exercise band or tubing, so that it is at the height of your shoulders when you are either standing, or sitting on a firm armless chair.  Grasp an end of the band in each hand and have your palms face each other. Straighten your elbows and lift your hands straight in front of you at shoulder height. Step back away from the secured end of band, until it becomes tense.  Squeeze your shoulder blades together. Keeping your elbows locked and your hands at shoulder height, spread your arms apart, forming a "T" shape with your body. Hold __________ seconds. Slowly ease the tension on the band, as you reverse the directions and return to the starting position. Repeat __________ times. Complete this exercise __________ times per day. STRENGTH - Scapular Retractors and External Rotators, Rowing  Secure a rubber exercise band or tubing, so that it is at the height of your shoulders when you are either standing, or sitting on a firm armless chair.  With a palm-down grip, grasp an end of the band in each hand. Straighten your elbows and lift  your hands straight in front of you at shoulder height. Step back away from the secured end of band, until it becomes tense.  Step 1: Squeeze your shoulder blades together. Bending your elbows, draw your hands to your chest as if you are rowing a boat. At the end of this motion, your hands and elbow should be at shoulder height and your elbows should be out to your sides.  Step 2: Rotate your shoulder to raise your hands above your head. Your forearms should be vertical and your upper arms should be horizontal.  Hold for __________ seconds. Slowly ease the tension on the band, as you reverse the directions and return to the starting position. Repeat __________ times. Complete this exercise __________ times per day.  POSTURE AND BODY MECHANICS CONSIDERATIONS - Mid-Back Strain Keeping correct posture when sitting, standing or completing your activities will reduce the stress put on different body tissues, allowing injured tissues a chance to heal and limiting painful experiences. The following are general guidelines for improved posture. Your physician or physical therapist will provide you with any instructions specific to your needs. While reading these guidelines, remember:  The exercises prescribed by your provider will help you have the flexibility and strength to maintain correct postures.  The correct posture provides the best environment for your joints to work. All of your joints have less wear and tear when properly supported by a spine with good posture. This means you will experience a healthier, less painful body.  Correct posture must be practiced with all of your activities, especially prolonged sitting and standing. Correct posture is as important when  doing repetitive low-stress activities (typing) as it is when doing a single heavy-load activity (lifting). PROPER SITTING POSTURE In order to minimize stress and discomfort on your spine, you must sit with correct posture. Sitting with  good posture should be effortless for a healthy body. Returning to good posture is a gradual process. Many people can work toward this most comfortably by using various supports until they have the flexibility and strength to maintain this posture on their own. When sitting with proper posture, your ears will fall over your shoulders and your shoulders will fall over your hips. You should use the back of the chair to support your upper back. Your lower back will be in a neutral position, just slightly arched. You may place a small pillow or folded towel at the base of your low back for  support.  When working at a desk, create an environment that supports good, upright posture. Without extra support, muscles fatigue and lead to excessive strain on joints and other tissues. Keep these recommendations in mind: CHAIR:  A chair should be able to slide under your desk when your back makes contact with the back of the chair. This allows you to work closely.  The chair's height should allow your eyes to be level with the upper part of your monitor and your hands to be slightly lower than your elbows. BODY POSITION  Your feet should make contact with the floor. If this is not possible, use a foot rest.  Keep your ears over your shoulders. This will reduce stress on your neck and lower back. INCORRECT SITTING POSTURES If you are feeling tired and unable to assume a healthy sitting posture, do not slouch or slump. This puts excessive strain on your back tissues, causing more damage and pain. Healthier options include:  Using more support, like a lumbar pillow.  Switching tasks to something that requires you to be upright or walking.  Talking a brief walk.  Lying down to rest in a neutral-spine position. CORRECT STANDING POSTURES Proper standing posture should be assumed with all daily activities, even if they only take a few moments, like when brushing your teeth. As in sitting, your ears should fall  over your shoulders and your shoulders should fall over your hips. You should keep a slight tension in your abdominal muscles to brace your spine. Your tailbone should point down to the ground, not behind your body, resulting in an over-extended swayback posture.  INCORRECT STANDING POSTURES Common incorrect standing postures include a forward head, locked knees, and an excessive swayback. WALKING Walk with an upright posture. Your ears, shoulders and hips should all line-up. CORRECT LIFTING TECHNIQUES DO :   Assume a wide stance. This will provide you more stability and the opportunity to get as close as possible to the object which you are lifting.  Tense your abdominals to brace your spine. Bend at the knees and hips. Keeping your back locked in a neutral-spine position, lift using your leg muscles. Lift with your legs, keeping your back straight.  Test the weight of unknown objects before attempting to lift them.  Try to keep your elbows locked down at your sides in order get the best strength from your shoulders when carrying an object.  Always ask for help when lifting heavy or awkward objects. INCORRECT LIFTING TECHNIQUES DO NOT:   Lock your knees when lifting, even if it is a small object.  Bend and twist. Pivot at your feet or move your feet when  needing to change directions.  Assume that you can safely pick up even a paperclip without proper posture.   This information is not intended to replace advice given to you by your health care provider. Make sure you discuss any questions you have with your health care provider.   Document Released: 01/08/2005 Document Revised: 05/25/2014 Document Reviewed: 04/22/2008 Elsevier Interactive Patient Education 2016 [employer]. Low Back Strain With Rehab A strain is an injury in which a tendon or muscle is torn. The muscles and tendons of the lower back are vulnerable to strains. However, these muscles and tendons are very strong and  require a great force to be injured. Strains are classified into three categories. Grade 1 strains cause pain, but the tendon is not lengthened. Grade 2 strains include a lengthened ligament, due to the ligament being stretched or partially ruptured. With grade 2 strains there is still function, although the function may be decreased. Grade 3 strains involve a complete tear of the tendon or muscle, and function is usually impaired. SYMPTOMS   Pain in the lower back.  Pain that affects one side more than the other.  Pain that gets worse with movement and may be felt in the hip, buttocks, or back of the thigh.  Muscle spasms of the muscles in the back.  Swelling along the muscles of the back.  Loss of strength of the back muscles.  Crackling sound (crepitation) when the muscles are touched. CAUSES  Lower back strains occur when a force is placed on the muscles or tendons that is greater than they can handle. Common causes of injury include:  Prolonged overuse of the muscle-tendon units in the lower back, usually from incorrect posture.  A single violent injury or force applied to the back. RISK INCREASES WITH:  Sports that involve twisting forces on the spine or a lot of bending at the waist (football, rugby, weightlifting, bowling, golf, tennis, speed skating, racquetball, swimming, running, gymnastics, diving).  Poor strength and flexibility.  Failure to warm up properly before activity.  Family history of lower back pain or disk disorders.  Previous back injury or surgery (especially fusion).  Poor posture with lifting, especially heavy objects.  Prolonged sitting, especially with poor posture. PREVENTION   Learn and use proper posture when sitting or lifting (maintain proper posture when sitting, lift using the knees and legs, not at the waist).  Warm up and stretch properly before activity.  Allow for adequate recovery between workouts.  Maintain physical  fitness:  Strength, flexibility, and endurance.  Cardiovascular fitness. PROGNOSIS  If treated properly, lower back strains usually heal within 6 weeks. RELATED COMPLICATIONS   Recurring symptoms, resulting in a chronic problem.  Chronic inflammation, scarring, and partial muscle-tendon tear.  Delayed healing or resolution of symptoms.  Prolonged disability. TREATMENT  Treatment first involves the use of ice and medicine, to reduce pain and inflammation. The use of strengthening and stretching exercises may help reduce pain with activity. These exercises may be performed at home or with a therapist. Severe injuries may require referral to a therapist for further evaluation and treatment, such as ultrasound. Your caregiver may advise that you wear a back brace or corset, to help reduce pain and discomfort. Often, prolonged bed rest results in greater harm then benefit. Corticosteroid injections may be recommended. However, these should be reserved for the most serious cases. It is important to avoid using your back when lifting objects. At night, sleep on your back on a firm mattress  with a pillow placed under your knees. If non-surgical treatment is unsuccessful, surgery may be needed.  MEDICATION   If pain medicine is needed, nonsteroidal anti-inflammatory medicines (aspirin and ibuprofen), or other minor pain relievers (acetaminophen), are often advised.  Do not take pain medicine for 7 days before surgery.  Prescription pain relievers may be given, if your caregiver thinks they are needed. Use only as directed and only as much as you need.  Ointments applied to the skin may be helpful.  Corticosteroid injections may be given by your caregiver. These injections should be reserved for the most serious cases, because they may only be given a certain number of times. HEAT AND COLD  Cold treatment (icing) should be applied for 10 to 15 minutes every 2 to 3 hours for inflammation and  pain, and immediately after activity that aggravates your symptoms. Use ice packs or an ice massage.  Heat treatment may be used before performing stretching and strengthening activities prescribed by your caregiver, physical therapist, or athletic trainer. Use a heat pack or a warm water soak. SEEK MEDICAL CARE IF:   Symptoms get worse or do not improve in 2 to 4 weeks, despite treatment.  You develop numbness, weakness, or loss of bowel or bladder function.  New, unexplained symptoms develop. (Drugs used in treatment may produce side effects.) EXERCISES  RANGE OF MOTION (ROM) AND STRETCHING EXERCISES - Low Back Strain Most people with lower back pain will find that their symptoms get worse with excessive bending forward (flexion) or arching at the lower back (extension). The exercises which will help resolve your symptoms will focus on the opposite motion.  Your physician, physical therapist or athletic trainer will help you determine which exercises will be most helpful to resolve your lower back pain. Do not complete any exercises without first consulting with your caregiver. Discontinue any exercises which make your symptoms worse until you speak to your caregiver.  If you have pain, numbness or tingling which travels down into your buttocks, leg or foot, the goal of the therapy is for these symptoms to move closer to your back and eventually resolve. Sometimes, these leg symptoms will get better, but your lower back pain may worsen. This is typically an indication of progress in your rehabilitation. Be very alert to any changes in your symptoms and the activities in which you participated in the 24 hours prior to the change. Sharing this information with your caregiver will allow him/her to most efficiently treat your condition.  These exercises may help you when beginning to rehabilitate your injury. Your symptoms may resolve with or without further involvement from your physician, physical  therapist or athletic trainer. While completing these exercises, remember:  Restoring tissue flexibility helps normal motion to return to the joints. This allows healthier, less painful movement and activity.  An effective stretch should be held for at least 30 seconds.  A stretch should never be painful. You should only feel a gentle lengthening or release in the stretched tissue. FLEXION RANGE OF MOTION AND STRETCHING EXERCISES: STRETCH - Flexion, Single Knee to Chest   Lie on a firm bed or floor with both legs extended in front of you.  Keeping one leg in contact with the floor, bring your opposite knee to your chest. Hold your leg in place by either grabbing behind your thigh or at your knee.  Pull until you feel a gentle stretch in your lower back. Hold __________ seconds.  Slowly release your grasp and repeat  the exercise with the opposite side. Repeat __________ times. Complete this exercise __________ times per day.  STRETCH - Flexion, Double Knee to Chest   Lie on a firm bed or floor with both legs extended in front of you.  Keeping one leg in contact with the floor, bring your opposite knee to your chest.  Tense your stomach muscles to support your back and then lift your other knee to your chest. Hold your legs in place by either grabbing behind your thighs or at your knees.  Pull both knees toward your chest until you feel a gentle stretch in your lower back. Hold __________ seconds.  Tense your stomach muscles and slowly return one leg at a time to the floor. Repeat __________ times. Complete this exercise __________ times per day.  STRETCH - Low Trunk Rotation  Lie on a firm bed or floor. Keeping your legs in front of you, bend your knees so they are both pointed toward the ceiling and your feet are flat on the floor.  Extend your arms out to the side. This will stabilize your upper body by keeping your shoulders in contact with the floor.  Gently and slowly drop  both knees together to one side until you feel a gentle stretch in your lower back. Hold for __________ seconds.  Tense your stomach muscles to support your lower back as you bring your knees back to the starting position. Repeat the exercise to the other side. Repeat __________ times. Complete this exercise __________ times per day  EXTENSION RANGE OF MOTION AND FLEXIBILITY EXERCISES: STRETCH - Extension, Prone on Elbows   Lie on your stomach on the floor, a bed will be too soft. Place your palms about shoulder width apart and at the height of your head.  Place your elbows under your shoulders. If this is too painful, stack pillows under your chest.  Allow your body to relax so that your hips drop lower and make contact more completely with the floor.  Hold this position for __________ seconds.  Slowly return to lying flat on the floor. Repeat __________ times. Complete this exercise __________ times per day.  RANGE OF MOTION - Extension, Prone Press Ups  Lie on your stomach on the floor, a bed will be too soft. Place your palms about shoulder width apart and at the height of your head.  Keeping your back as relaxed as possible, slowly straighten your elbows while keeping your hips on the floor. You may adjust the placement of your hands to maximize your comfort. As you gain motion, your hands will come more underneath your shoulders.  Hold this position __________ seconds.  Slowly return to lying flat on the floor. Repeat __________ times. Complete this exercise __________ times per day.  RANGE OF MOTION- Quadruped, Neutral Spine   Assume a hands and knees position on a firm surface. Keep your hands under your shoulders and your knees under your hips. You may place padding under your knees for comfort.  Drop your head and point your tail bone toward the ground below you. This will round out your lower back like an angry cat. Hold this position for __________ seconds.  Slowly lift  your head and release your tail bone so that your back sags into a large arch, like an old horse.  Hold this position for __________ seconds.  Repeat this until you feel limber in your lower back.  Now, find your "sweet spot." This will be the most comfortable position somewhere  between the two previous positions. This is your neutral spine. Once you have found this position, tense your stomach muscles to support your lower back.  Hold this position for __________ seconds. Repeat __________ times. Complete this exercise __________ times per day.  STRENGTHENING EXERCISES - Low Back Strain These exercises may help you when beginning to rehabilitate your injury. These exercises should be done near your "sweet spot." This is the neutral, low-back arch, somewhere between fully rounded and fully arched, that is your least painful position. When performed in this safe range of motion, these exercises can be used for people who have either a flexion or extension based injury. These exercises may resolve your symptoms with or without further involvement from your physician, physical therapist or athletic trainer. While completing these exercises, remember:   Muscles can gain both the endurance and the strength needed for everyday activities through controlled exercises.  Complete these exercises as instructed by your physician, physical therapist or athletic trainer. Increase the resistance and repetitions only as guided.  You may experience muscle soreness or fatigue, but the pain or discomfort you are trying to eliminate should never worsen during these exercises. If this pain does worsen, stop and make certain you are following the directions exactly. If the pain is still present after adjustments, discontinue the exercise until you can discuss the trouble with your caregiver. STRENGTHENING - Deep Abdominals, Pelvic Tilt  Lie on a firm bed or floor. Keeping your legs in front of you, bend your knees so  they are both pointed toward the ceiling and your feet are flat on the floor.  Tense your lower abdominal muscles to press your lower back into the floor. This motion will rotate your pelvis so that your tail bone is scooping upwards rather than pointing at your feet or into the floor.  With a gentle tension and even breathing, hold this position for __________ seconds. Repeat __________ times. Complete this exercise __________ times per day.  STRENGTHENING - Abdominals, Crunches   Lie on a firm bed or floor. Keeping your legs in front of you, bend your knees so they are both pointed toward the ceiling and your feet are flat on the floor. Cross your arms over your chest.  Slightly tip your chin down without bending your neck.  Tense your abdominals and slowly lift your trunk high enough to just clear your shoulder blades. Lifting higher can put excessive stress on the lower back and does not further strengthen your abdominal muscles.  Control your return to the starting position. Repeat __________ times. Complete this exercise __________ times per day.  STRENGTHENING - Quadruped, Opposite UE/LE Lift   Assume a hands and knees position on a firm surface. Keep your hands under your shoulders and your knees under your hips. You may place padding under your knees for comfort.  Find your neutral spine and gently tense your abdominal muscles so that you can maintain this position. Your shoulders and hips should form a rectangle that is parallel with the floor and is not twisted.  Keeping your trunk steady, lift your right hand no higher than your shoulder and then your left leg no higher than your hip. Make sure you are not holding your breath. Hold this position __________ seconds.  Continuing to keep your abdominal muscles tense and your back steady, slowly return to your starting position. Repeat with the opposite arm and leg. Repeat __________ times. Complete this exercise __________ times per  day.  STRENGTHENING - Lower  Abdominals, Double Knee Lift  Lie on a firm bed or floor. Keeping your legs in front of you, bend your knees so they are both pointed toward the ceiling and your feet are flat on the floor.  Tense your abdominal muscles to brace your lower back and slowly lift both of your knees until they come over your hips. Be certain not to hold your breath.  Hold __________ seconds. Using your abdominal muscles, return to the starting position in a slow and controlled manner. Repeat __________ times. Complete this exercise __________ times per day.  POSTURE AND BODY MECHANICS CONSIDERATIONS - Low Back Strain Keeping correct posture when sitting, standing or completing your activities will reduce the stress put on different body tissues, allowing injured tissues a chance to heal and limiting painful experiences. The following are general guidelines for improved posture. Your physician or physical therapist will provide you with any instructions specific to your needs. While reading these guidelines, remember:  The exercises prescribed by your provider will help you have the flexibility and strength to maintain correct postures.  The correct posture provides the best environment for your joints to work. All of your joints have less wear and tear when properly supported by a spine with good posture. This means you will experience a healthier, less painful body.  Correct posture must be practiced with all of your activities, especially prolonged sitting and standing. Correct posture is as important when doing repetitive low-stress activities (typing) as it is when doing a single heavy-load activity (lifting). RESTING POSITIONS Consider which positions are most painful for you when choosing a resting position. If you have pain with flexion-based activities (sitting, bending, stooping, squatting), choose a position that allows you to rest in a less flexed posture. You would want to avoid  curling into a fetal position on your side. If your pain worsens with extension-based activities (prolonged standing, working overhead), avoid resting in an extended position such as sleeping on your stomach. Most people will find more comfort when they rest with their spine in a more neutral position, neither too rounded nor too arched. Lying on a non-sagging bed on your side with a pillow between your knees, or on your back with a pillow under your knees will often provide some relief. Keep in mind, being in any one position for a prolonged period of time, no matter how correct your posture, can still lead to stiffness. PROPER SITTING POSTURE In order to minimize stress and discomfort on your spine, you must sit with correct posture. Sitting with good posture should be effortless for a healthy body. Returning to good posture is a gradual process. Many people can work toward this most comfortably by using various supports until they have the flexibility and strength to maintain this posture on their own. When sitting with proper posture, your ears will fall over your shoulders and your shoulders will fall over your hips. You should use the back of the chair to support your upper back. Your lower back will be in a neutral position, just slightly arched. You may place a small pillow or folded towel at the base of your lower back for support.  When working at a desk, create an environment that supports good, upright posture. Without extra support, muscles tire, which leads to excessive strain on joints and other tissues. Keep these recommendations in mind: CHAIR:  A chair should be able to slide under your desk when your back makes contact with the back of the chair. This  allows you to work closely.  The chair's height should allow your eyes to be level with the upper part of your monitor and your hands to be slightly lower than your elbows. BODY POSITION  Your feet should make contact with the floor. If  this is not possible, use a foot rest.  Keep your ears over your shoulders. This will reduce stress on your neck and lower back. INCORRECT SITTING POSTURES  If you are feeling tired and unable to assume a healthy sitting posture, do not slouch or slump. This puts excessive strain on your back tissues, causing more damage and pain. Healthier options include:  Using more support, like a lumbar pillow.  Switching tasks to something that requires you to be upright or walking.  Talking a brief walk.  Lying down to rest in a neutral-spine position. PROLONGED STANDING WHILE SLIGHTLY LEANING FORWARD  When completing a task that requires you to lean forward while standing in one place for a long time, place either foot up on a stationary 2-4 inch high object to help maintain the best posture. When both feet are on the ground, the lower back tends to lose its slight inward curve. If this curve flattens (or becomes too large), then the back and your other joints will experience too much stress, tire more quickly, and can cause pain. CORRECT STANDING POSTURES Proper standing posture should be assumed with all daily activities, even if they only take a few moments, like when brushing your teeth. As in sitting, your ears should fall over your shoulders and your shoulders should fall over your hips. You should keep a slight tension in your abdominal muscles to brace your spine. Your tailbone should point down to the ground, not behind your body, resulting in an over-extended swayback posture.  INCORRECT STANDING POSTURES  Common incorrect standing postures include a forward head, locked knees and/or an excessive swayback. WALKING Walk with an upright posture. Your ears, shoulders and hips should all line-up. PROLONGED ACTIVITY IN A FLEXED POSITION When completing a task that requires you to bend forward at your waist or lean over a low surface, try to find a way to stabilize 3 out of 4 of your limbs. You can  place a hand or elbow on your thigh or rest a knee on the surface you are reaching across. This will provide you more stability so that your muscles do not fatigue as quickly. By keeping your knees relaxed, or slightly bent, you will also reduce stress across your lower back. CORRECT LIFTING TECHNIQUES DO :   Assume a wide stance. This will provide you more stability and the opportunity to get as close as possible to the object which you are lifting.  Tense your abdominals to brace your spine. Bend at the knees and hips. Keeping your back locked in a neutral-spine position, lift using your leg muscles. Lift with your legs, keeping your back straight.  Test the weight of unknown objects before attempting to lift them.  Try to keep your elbows locked down at your sides in order get the best strength from your shoulders when carrying an object.  Always ask for help when lifting heavy or awkward objects. INCORRECT LIFTING TECHNIQUES DO NOT:   Lock your knees when lifting, even if it is a small object.  Bend and twist. Pivot at your feet or move your feet when needing to change directions.  Assume that you can safely pick up even a paper clip without proper posture.  This information is not intended to replace advice given to you by your health care provider. Make sure you discuss any questions you have with your health care provider.   Document Released: 01/08/2005 Document Revised: 01/29/2014 Document Reviewed: 04/22/2008 Elsevier Interactive Patient Education [employer]

## 2015-01-09 ENCOUNTER — Ambulatory Visit
Admission: EM | Admit: 2015-01-09 | Discharge: 2015-01-09 | Disposition: A | Discharge status: Home / Self Care | Payer: Medicaid Other | Attending: Family Medicine | Admitting: Family Medicine

## 2015-01-09 ENCOUNTER — Ambulatory Visit: Payer: Medicaid Other

## 2015-01-09 ENCOUNTER — Encounter: Payer: Self-pay | Admitting: Emergency Medicine

## 2015-01-09 DIAGNOSIS — S39011D Strain of muscle, fascia and tendon of abdomen, subsequent encounter: Secondary | ICD-10-CM

## 2015-01-09 DIAGNOSIS — S2232XA Fracture of one rib, left side, initial encounter for closed fracture: Secondary | ICD-10-CM

## 2015-01-09 DIAGNOSIS — R0781 Pleurodynia: Secondary | ICD-10-CM | POA: Insufficient documentation

## 2015-01-09 DIAGNOSIS — S2232XS Fracture of one rib, left side, sequela: Secondary | ICD-10-CM
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
## 2015-01-09 NOTE — Discharge Instructions (Signed)
Rest .  Apply warm moist compresses 4 times daily for at least 20 minutes. You will need to follow-up with your primary care physician and you may need physical therapy for abdominal wall pain.  As pain recedes, begin normal activities slowly as tolerated.   Rib Fracture A rib fracture is a break or crack in one of the bones of the ribs. The ribs are a group of long, curved bones that wrap around your chest and attach to your spine. They protect your lungs and other organs in the chest cavity. A broken or cracked rib is often painful, but most do not cause other problems. Most rib fractures heal on their own over time. However, rib fractures can be more serious if multiple ribs are broken or if broken ribs move out of place and push against other structures. CAUSES   A direct blow to the chest. For example, this could happen during contact sports, a car accident, or a fall against a hard object.  Repetitive movements with high force, such as pitching a baseball or having severe coughing spells. SYMPTOMS   Pain when you breathe in or cough.  Pain when someone presses on the injured area. DIAGNOSIS  Your caregiver will perform a physical exam. Various imaging tests may be ordered to confirm the diagnosis and to look for related injuries. These tests may include a chest X-ray, computed tomography (CT), magnetic resonance imaging (MRI), or a bone scan. TREATMENT  Rib fractures usually heal on their own in 1-3 months. The longer healing period is often associated with a continued cough or other aggravating activities. During the healing period, pain control is very important. Medication is usually given to control pain. Hospitalization or surgery may be needed for more severe injuries, such as those in which multiple ribs are broken or the ribs have moved out of place.  HOME CARE INSTRUCTIONS   Avoid strenuous activity and any activities or movements that cause pain. Be careful during activities and  avoid bumping the injured rib.  Gradually increase activity as directed by your caregiver.  Only take over-the-counter or prescription medications as directed by your caregiver. Do not take other medications without asking your caregiver first.  Apply ice to the injured area for the first 1-2 days after you have been treated or as directed by your caregiver. Applying ice helps to reduce inflammation and pain.  Put ice in a plastic bag.  Place a towel between your skin and the bag.   Leave the ice on for 15-20 minutes at a time, every 2 hours while you are awake.  Perform deep breathing as directed by your caregiver. This will help prevent pneumonia, which is a common complication of a broken rib. Your caregiver may instruct you to:  Take deep breaths several times a day.  Try to cough several times a day, holding a pillow against the injured area.  Use a device called an incentive spirometer to practice deep breathing several times a day.  Drink enough fluids to keep your urine clear or pale yellow. This will help you avoid constipation.   Do not wear a rib belt or binder. These restrict breathing, which can lead to pneumonia.  SEEK IMMEDIATE MEDICAL CARE IF:   You have a fever.   You have difficulty breathing or shortness of breath.   You develop a continual cough, or you cough up thick or bloody sputum.  You feel sick to your stomach (nausea), throw up (vomit), or have abdominal  pain.   You have worsening pain not controlled with medications.  MAKE SURE YOU:  Understand these instructions.  Will watch your condition.  Will get help right away if you are not doing well or get worse.   This information is not intended to replace advice given to you by your health care provider. Make sure you discuss any questions you have with your health care provider.   Document Released: 01/08/2005 Document Revised: 09/10/2012 Document Reviewed: 03/12/2012 Elsevier Interactive  Patient Education [employer]
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
## 2019-12-22 ENCOUNTER — Emergency Department
Admission: EM | Admit: 2019-12-22 | Discharge: 2019-12-22 | Disposition: A | Discharge status: Home / Self Care | Payer: Medicaid Other | Attending: Emergency Medicine | Admitting: Emergency Medicine

## 2019-12-22 ENCOUNTER — Other Ambulatory Visit: Payer: Self-pay

## 2019-12-22 ENCOUNTER — Emergency Department: Payer: Medicaid Other

## 2019-12-22 DIAGNOSIS — Y92410 Unspecified street and highway as the place of occurrence of the external cause: Secondary | ICD-10-CM | POA: Diagnosis not present

## 2019-12-22 DIAGNOSIS — Y9389 Activity, other specified: Secondary | ICD-10-CM | POA: Insufficient documentation

## 2019-12-22 DIAGNOSIS — W2211XA Striking against or struck by driver side automobile airbag, initial encounter: Secondary | ICD-10-CM | POA: Diagnosis not present

## 2019-12-22 DIAGNOSIS — J45909 Unspecified asthma, uncomplicated: Secondary | ICD-10-CM | POA: Insufficient documentation

## 2019-12-22 DIAGNOSIS — Y999 Unspecified external cause status: Secondary | ICD-10-CM | POA: Diagnosis not present

## 2019-12-22 DIAGNOSIS — S62344A Nondisplaced fracture of base of fourth metacarpal bone, right hand, initial encounter for closed fracture: Secondary | ICD-10-CM | POA: Insufficient documentation

## 2019-12-22 DIAGNOSIS — M7918 Myalgia, other site: Secondary | ICD-10-CM

## 2019-12-22 DIAGNOSIS — S20211A Contusion of right front wall of thorax, initial encounter: Secondary | ICD-10-CM | POA: Insufficient documentation

## 2019-12-22 DIAGNOSIS — S299XXA Unspecified injury of thorax, initial encounter: Secondary | ICD-10-CM | POA: Diagnosis not present

## 2019-12-22 DIAGNOSIS — S6991XA Unspecified injury of right wrist, hand and finger(s), initial encounter: Secondary | ICD-10-CM | POA: Diagnosis present

## 2019-12-22 MED ORDER — HYDROCODONE-ACETAMINOPHEN 5-325 MG PO TABS
1.0000 | ORAL_TABLET | Freq: Once | ORAL | Status: AC
  Administered 2019-12-22: 1 via ORAL
  Filled 2019-12-22: qty 1

## 2019-12-22 MED ORDER — ONDANSETRON 4 MG PO TBDP
4.0000 mg | ORAL_TABLET | Freq: Once | ORAL | Status: AC
  Administered 2019-12-22: 4 mg via ORAL
  Filled 2019-12-22: qty 1

## 2019-12-22 MED ORDER — CYCLOBENZAPRINE HCL 5 MG PO TABS: 5.0000 mg | ORAL_TABLET | Freq: Three times a day (TID) | ORAL | 0 refills | Status: AC | PRN

## 2019-12-22 MED ORDER — IBUPROFEN 800 MG PO TABS: 800.0000 mg | ORAL_TABLET | Freq: Three times a day (TID) | ORAL | 0 refills | Status: AC | PRN
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
## 2019-12-22 NOTE — Discharge Instructions (Addendum)
Your exam and x-rays are normal except as reported.  Wear the splint on the hand until you are evaluated and followed up by orthopedics.  Take the prescription medicines as provided.  Apply ice to any sore muscles.  You may experience increased muscle tenderness and soreness before symptoms resolve.  Follow-up with primary provider return to the ED if needed.
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
## 2021-05-08 ENCOUNTER — Other Ambulatory Visit: Payer: Medicaid Other
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
